# Patient Record
Sex: Female | Born: 1935 | Race: Black or African American | Hispanic: No
Health system: Southern US, Community
[De-identification: ages and names within clinical notes are randomized; demographics above are authoritative.]

---

## 2003-05-23 ENCOUNTER — Other Ambulatory Visit: Payer: Self-pay

## 2003-09-25 ENCOUNTER — Other Ambulatory Visit: Payer: Self-pay

## 2004-01-13 ENCOUNTER — Other Ambulatory Visit: Payer: Self-pay

## 2004-01-16 ENCOUNTER — Other Ambulatory Visit: Payer: Self-pay

## 2004-03-22 ENCOUNTER — Other Ambulatory Visit: Payer: Self-pay

## 2004-04-01 ENCOUNTER — Other Ambulatory Visit: Payer: Self-pay

## 2004-04-27 ENCOUNTER — Other Ambulatory Visit: Payer: Self-pay

## 2004-04-27 ENCOUNTER — Inpatient Hospital Stay: Payer: Self-pay | Admitting: Specialist

## 2004-06-03 ENCOUNTER — Ambulatory Visit: Payer: Self-pay | Admitting: Internal Medicine

## 2004-06-20 ENCOUNTER — Ambulatory Visit: Payer: Self-pay | Admitting: Internal Medicine

## 2004-07-21 ENCOUNTER — Ambulatory Visit: Payer: Self-pay | Admitting: Internal Medicine

## 2004-09-16 ENCOUNTER — Ambulatory Visit: Payer: Self-pay | Admitting: Internal Medicine

## 2004-10-12 ENCOUNTER — Emergency Department: Payer: Self-pay | Admitting: Emergency Medicine

## 2005-03-21 ENCOUNTER — Other Ambulatory Visit: Payer: Self-pay

## 2005-03-21 ENCOUNTER — Emergency Department: Payer: Self-pay | Admitting: Unknown Physician Specialty

## 2006-02-27 ENCOUNTER — Emergency Department: Payer: Self-pay | Admitting: Emergency Medicine

## 2006-02-27 ENCOUNTER — Other Ambulatory Visit: Payer: Self-pay

## 2006-08-05 ENCOUNTER — Emergency Department: Payer: Self-pay | Admitting: Emergency Medicine

## 2006-11-22 ENCOUNTER — Emergency Department: Payer: Self-pay | Admitting: Emergency Medicine

## 2006-12-01 ENCOUNTER — Inpatient Hospital Stay: Payer: Self-pay | Admitting: Specialist

## 2006-12-01 ENCOUNTER — Other Ambulatory Visit: Payer: Self-pay

## 2006-12-02 ENCOUNTER — Other Ambulatory Visit: Payer: Self-pay

## 2006-12-10 ENCOUNTER — Emergency Department: Payer: Self-pay | Admitting: Emergency Medicine

## 2006-12-10 ENCOUNTER — Other Ambulatory Visit: Payer: Self-pay

## 2006-12-16 ENCOUNTER — Other Ambulatory Visit: Payer: Self-pay

## 2006-12-16 ENCOUNTER — Emergency Department: Payer: Self-pay | Admitting: Emergency Medicine

## 2007-01-11 ENCOUNTER — Ambulatory Visit: Payer: Self-pay | Admitting: Specialist

## 2007-01-25 ENCOUNTER — Emergency Department: Payer: Self-pay | Admitting: Emergency Medicine

## 2008-02-05 ENCOUNTER — Emergency Department: Payer: Self-pay | Admitting: Emergency Medicine

## 2008-02-05 ENCOUNTER — Other Ambulatory Visit: Payer: Self-pay

## 2008-05-15 ENCOUNTER — Inpatient Hospital Stay: Payer: Self-pay | Admitting: Internal Medicine

## 2008-09-19 ENCOUNTER — Ambulatory Visit: Payer: Self-pay | Admitting: Specialist

## 2008-11-22 ENCOUNTER — Inpatient Hospital Stay: Payer: Self-pay | Admitting: Specialist

## 2009-01-26 ENCOUNTER — Emergency Department: Payer: Self-pay | Admitting: Emergency Medicine

## 2009-05-08 ENCOUNTER — Inpatient Hospital Stay: Payer: Self-pay | Admitting: Specialist

## 2009-10-05 ENCOUNTER — Inpatient Hospital Stay: Payer: Self-pay | Admitting: Specialist

## 2009-11-05 ENCOUNTER — Emergency Department: Payer: Self-pay

## 2010-04-18 ENCOUNTER — Emergency Department: Payer: Self-pay | Admitting: Emergency Medicine

## 2010-08-14 ENCOUNTER — Inpatient Hospital Stay: Payer: Self-pay | Admitting: Specialist

## 2010-08-21 ENCOUNTER — Emergency Department: Payer: Self-pay | Admitting: Emergency Medicine

## 2010-10-20 ENCOUNTER — Ambulatory Visit: Payer: Self-pay | Admitting: Internal Medicine

## 2010-11-17 ENCOUNTER — Inpatient Hospital Stay: Payer: Self-pay | Admitting: Specialist

## 2010-11-19 ENCOUNTER — Ambulatory Visit: Payer: Self-pay | Admitting: Internal Medicine

## 2010-12-20 ENCOUNTER — Ambulatory Visit: Payer: Self-pay | Admitting: Internal Medicine

## 2011-01-05 ENCOUNTER — Inpatient Hospital Stay: Payer: Self-pay | Admitting: Specialist

## 2011-04-19 ENCOUNTER — Inpatient Hospital Stay: Payer: Self-pay | Admitting: Internal Medicine

## 2011-11-19 ENCOUNTER — Ambulatory Visit: Payer: Self-pay | Admitting: Internal Medicine

## 2011-12-12 ENCOUNTER — Inpatient Hospital Stay: Payer: Self-pay | Admitting: Student

## 2011-12-12 LAB — URINALYSIS, COMPLETE
Bacteria: NONE SEEN
Bilirubin,UR: NEGATIVE
Glucose,UR: NEGATIVE mg/dL (ref 0–75)
Nitrite: NEGATIVE
Ph: 7 (ref 4.5–8.0)
Protein: 30
RBC,UR: 1 /HPF (ref 0–5)
Specific Gravity: 1.013 (ref 1.003–1.030)
WBC UR: 49 /HPF (ref 0–5)

## 2011-12-12 LAB — COMPREHENSIVE METABOLIC PANEL
BUN: 45 mg/dL — ABNORMAL HIGH (ref 7–18)
Co2: 22 mmol/L (ref 21–32)
Creatinine: 2 mg/dL — ABNORMAL HIGH (ref 0.60–1.30)
EGFR (African American): 25 — ABNORMAL LOW
EGFR (Non-African Amer.): 21 — ABNORMAL LOW
Osmolality: 309 (ref 275–301)
Potassium: 5.1 mmol/L (ref 3.5–5.1)
SGOT(AST): 48 U/L — ABNORMAL HIGH (ref 15–37)

## 2011-12-12 LAB — CK TOTAL AND CKMB (NOT AT ARMC)
CK, Total: 226 U/L — ABNORMAL HIGH (ref 21–215)
CK-MB: 1.1 ng/mL (ref 0.5–3.6)

## 2011-12-12 LAB — TROPONIN I: Troponin-I: <0.02 ng/mL

## 2011-12-12 LAB — CBC
HCT: 44.9 % (ref 35.0–47.0)
HGB: 13.7 g/dL (ref 12.0–16.0)
MCH: 25.1 pg — ABNORMAL LOW (ref 26.0–34.0)
MCHC: 30.5 g/dL — ABNORMAL LOW (ref 32.0–36.0)
Platelet: 202 10*3/uL (ref 150–440)
RDW: 15 % — ABNORMAL HIGH (ref 11.5–14.5)
WBC: 2.3 10*3/uL — ABNORMAL LOW (ref 3.6–11.0)

## 2011-12-12 LAB — SODIUM: Sodium: 150 mmol/L — ABNORMAL HIGH (ref 136–145)

## 2011-12-12 LAB — CLOSTRIDIUM DIFFICILE BY PCR

## 2011-12-12 LAB — URIC ACID: Uric Acid: 6.6 mg/dL — ABNORMAL HIGH (ref 2.6–6.0)

## 2011-12-12 LAB — OCCULT BLOOD X 1 CARD TO LAB, STOOL: Occult Blood, Feces: NEGATIVE

## 2011-12-13 LAB — CBC WITH DIFFERENTIAL/PLATELET
Basophil #: 0 10*3/uL (ref 0.0–0.1)
Basophil %: 0.2 %
Eosinophil #: 0 10*3/uL (ref 0.0–0.7)
Eosinophil %: 0.1 %
HCT: 36.2 % (ref 35.0–47.0)
Lymphocyte %: 6.9 %
Monocyte #: 0.5 x10 3/mm (ref 0.2–0.9)
RDW: 15 % — ABNORMAL HIGH (ref 11.5–14.5)
WBC: 9.1 10*3/uL (ref 3.6–11.0)

## 2011-12-13 LAB — URINE CULTURE

## 2011-12-13 LAB — BASIC METABOLIC PANEL
BUN: 50 mg/dL — ABNORMAL HIGH (ref 7–18)
Chloride: 113 mmol/L — ABNORMAL HIGH (ref 98–107)
Creatinine: 2.06 mg/dL — ABNORMAL HIGH (ref 0.60–1.30)
EGFR (African American): 24 — ABNORMAL LOW
EGFR (Non-African Amer.): 20 — ABNORMAL LOW
Glucose: 157 mg/dL — ABNORMAL HIGH (ref 65–99)
Osmolality: 309 (ref 275–301)
Sodium: 147 mmol/L — ABNORMAL HIGH (ref 136–145)

## 2011-12-13 LAB — TROPONIN I: Troponin-I: <0.02 ng/mL

## 2011-12-14 LAB — BASIC METABOLIC PANEL
Anion Gap: 11 (ref 7–16)
BUN: 40 mg/dL — ABNORMAL HIGH (ref 7–18)
Calcium, Total: 7.3 mg/dL — ABNORMAL LOW (ref 8.5–10.1)
Chloride: 113 mmol/L — ABNORMAL HIGH (ref 98–107)
Co2: 17 mmol/L — ABNORMAL LOW (ref 21–32)
Creatinine: 1.62 mg/dL — ABNORMAL HIGH (ref 0.60–1.30)
EGFR (African American): 32 — ABNORMAL LOW
EGFR (Non-African Amer.): 27 — ABNORMAL LOW
Glucose: 92 mg/dL (ref 65–99)
Osmolality: 291 (ref 275–301)
Sodium: 141 mmol/L (ref 136–145)

## 2011-12-14 LAB — LIPID PANEL
Cholesterol: 126 mg/dL (ref 0–200)
Triglycerides: 65 mg/dL (ref 0–200)
VLDL Cholesterol, Calc: 13 mg/dL (ref 5–40)

## 2011-12-15 LAB — BASIC METABOLIC PANEL
Anion Gap: 10 (ref 7–16)
BUN: 35 mg/dL — ABNORMAL HIGH (ref 7–18)
Calcium, Total: 8.1 mg/dL — ABNORMAL LOW (ref 8.5–10.1)
Chloride: 104 mmol/L (ref 98–107)
Co2: 22 mmol/L (ref 21–32)
Creatinine: 1.61 mg/dL — ABNORMAL HIGH (ref 0.60–1.30)
Osmolality: 283 (ref 275–301)
Potassium: 3.6 mmol/L (ref 3.5–5.1)

## 2011-12-16 LAB — CBC WITH DIFFERENTIAL/PLATELET
Basophil #: 0.1 10*3/uL (ref 0.0–0.1)
Basophil %: 0.6 %
Eosinophil #: 0 10*3/uL (ref 0.0–0.7)
Eosinophil %: 0.1 %
HCT: 41.5 % (ref 35.0–47.0)
Lymphocyte %: 7.9 %
MCH: 25.8 pg — ABNORMAL LOW (ref 26.0–34.0)
MCV: 79 fL — ABNORMAL LOW (ref 80–100)
Neutrophil #: 8.1 10*3/uL — ABNORMAL HIGH (ref 1.4–6.5)
Neutrophil %: 89.6 %
RDW: 14.9 % — ABNORMAL HIGH (ref 11.5–14.5)

## 2011-12-16 LAB — BASIC METABOLIC PANEL
Anion Gap: 10 (ref 7–16)
BUN: 31 mg/dL — ABNORMAL HIGH (ref 7–18)
Calcium, Total: 8.6 mg/dL (ref 8.5–10.1)
Co2: 23 mmol/L (ref 21–32)
Creatinine: 1.45 mg/dL — ABNORMAL HIGH (ref 0.60–1.30)
EGFR (African American): 36 — ABNORMAL LOW
Glucose: 85 mg/dL (ref 65–99)
Osmolality: 281 (ref 275–301)
Potassium: 3.6 mmol/L (ref 3.5–5.1)
Sodium: 138 mmol/L (ref 136–145)

## 2011-12-20 ENCOUNTER — Ambulatory Visit: Payer: Self-pay | Admitting: Internal Medicine

## 2012-01-19 DEATH — deceased

## 2014-03-27 IMAGING — US US CAROTID DUPLEX BILAT
1 series · 14 of 24 positions shown · non-contrast
Comparison: none

REASON FOR EXAM: cva. eval for stenosis
COMMENTS:

[Series 1: us carotid duplex bilat · 14 of 66 slices shown]
[im 1/66]
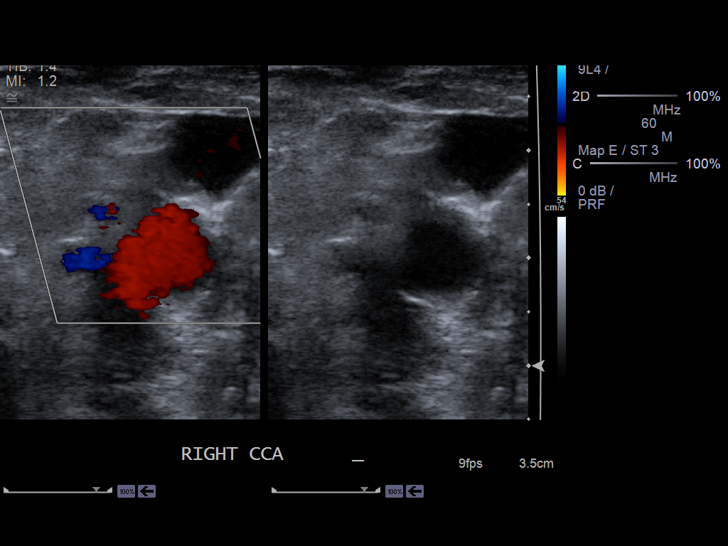
[im 6/66]
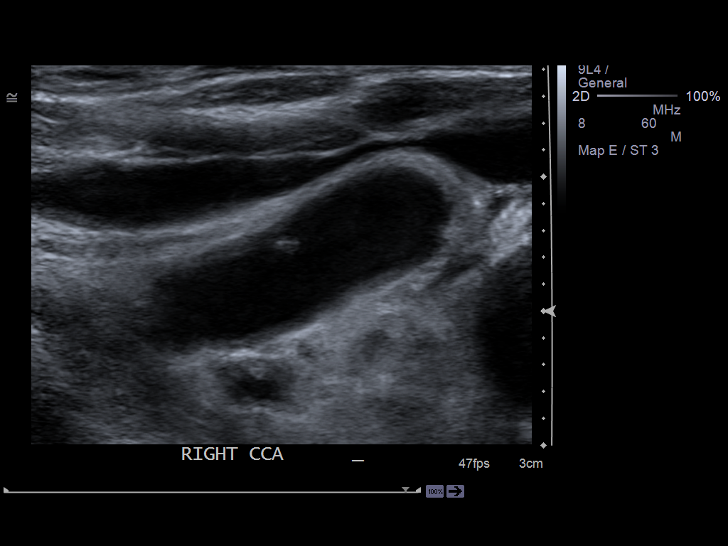
[im 12/66]
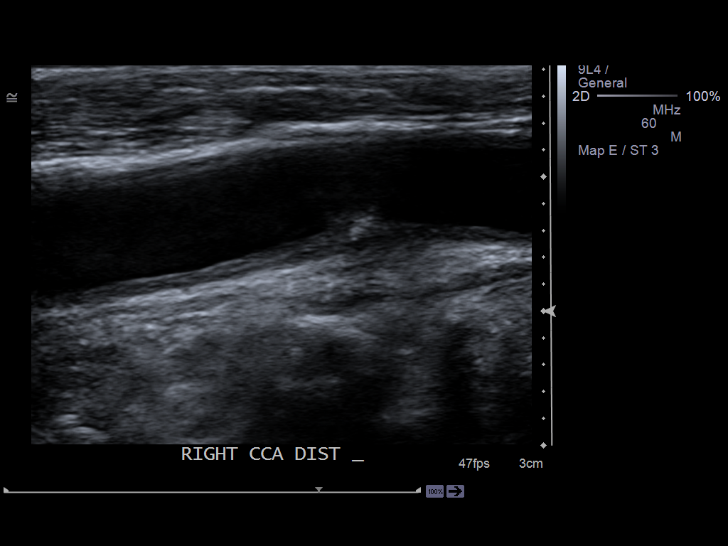
[im 17/66]
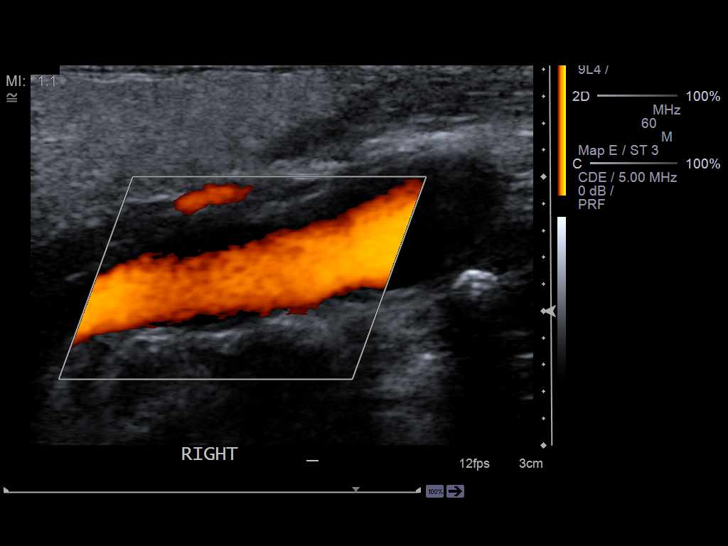
[im 20/66]
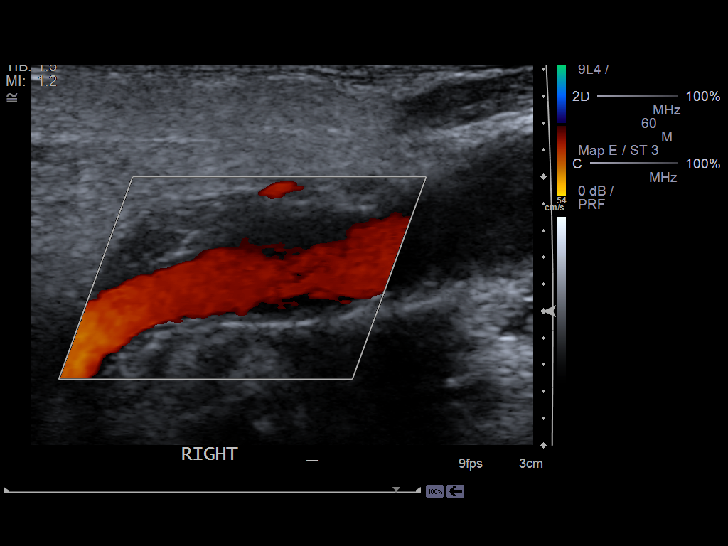
[im 26/66]
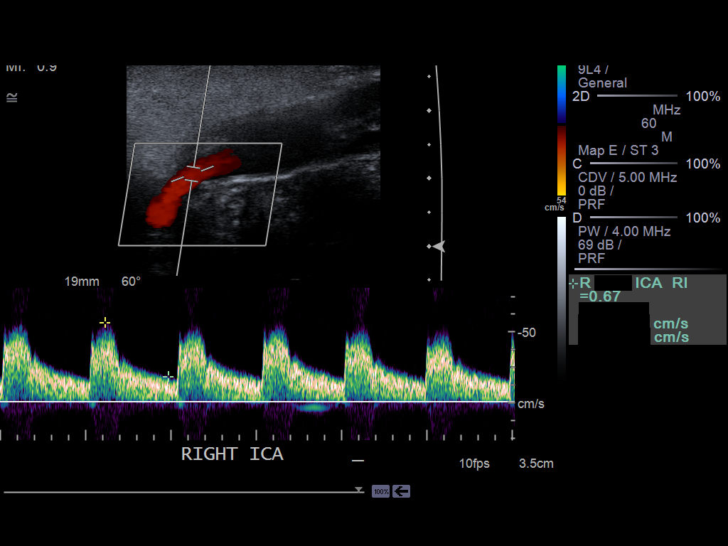
[im 32/66]
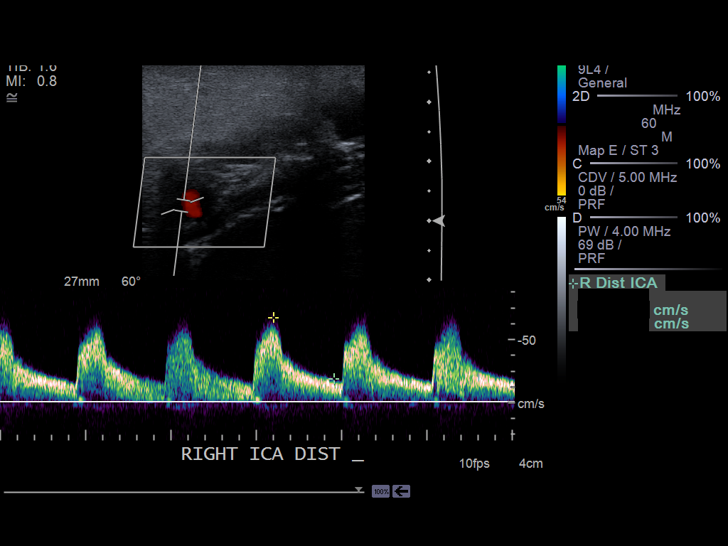
[im 34/66]
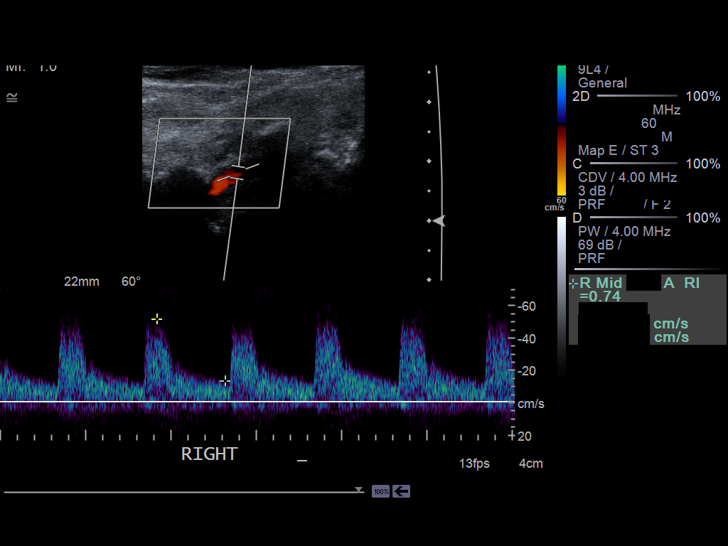
[im 40/66]
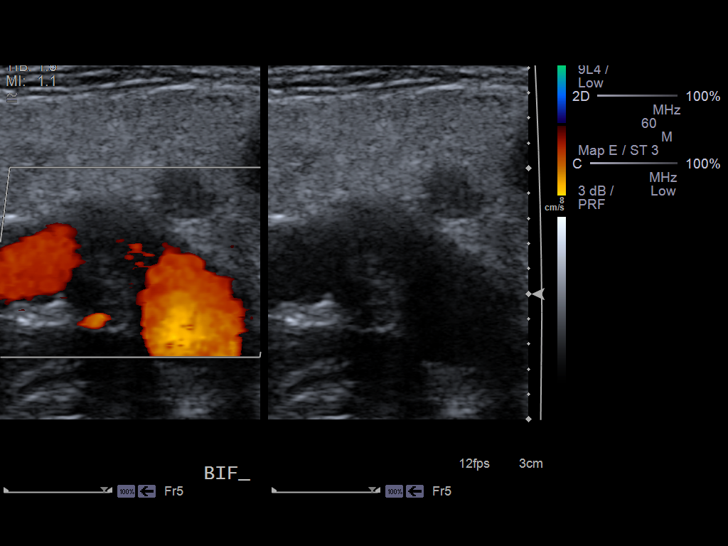
[im 46/66]
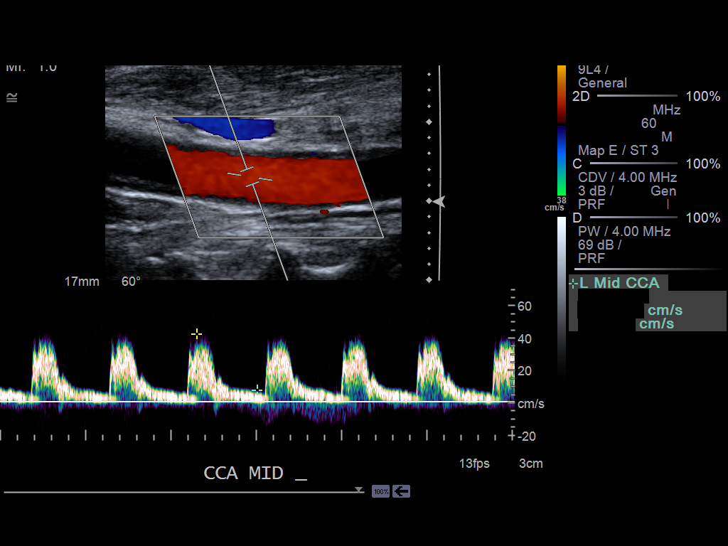
[im 51/66]
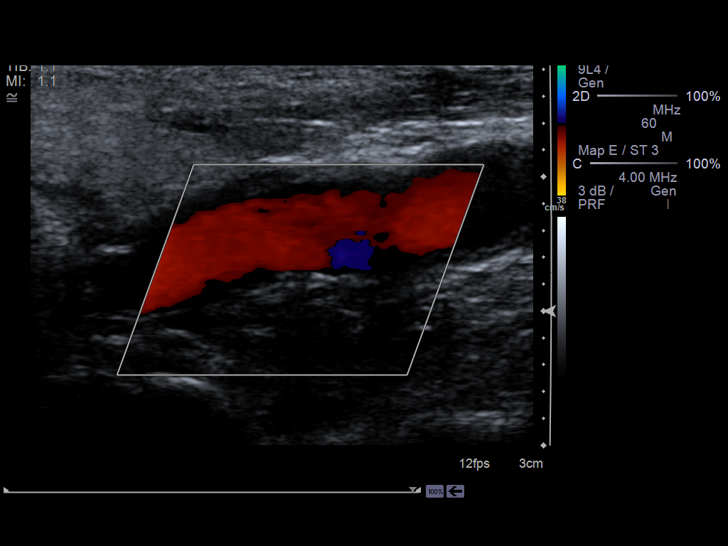
[im 54/66]
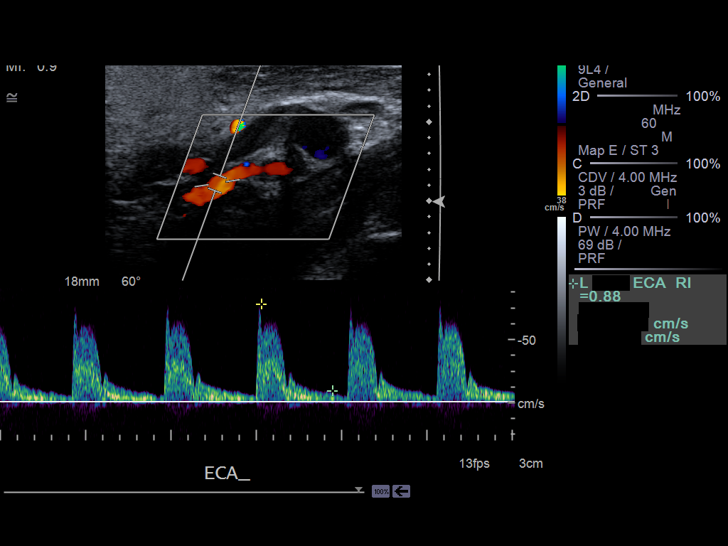
[im 60/66]
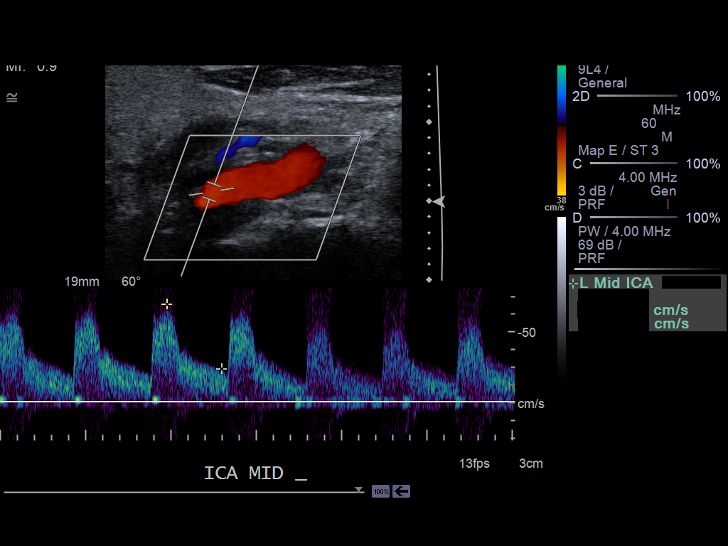
[im 66/66]
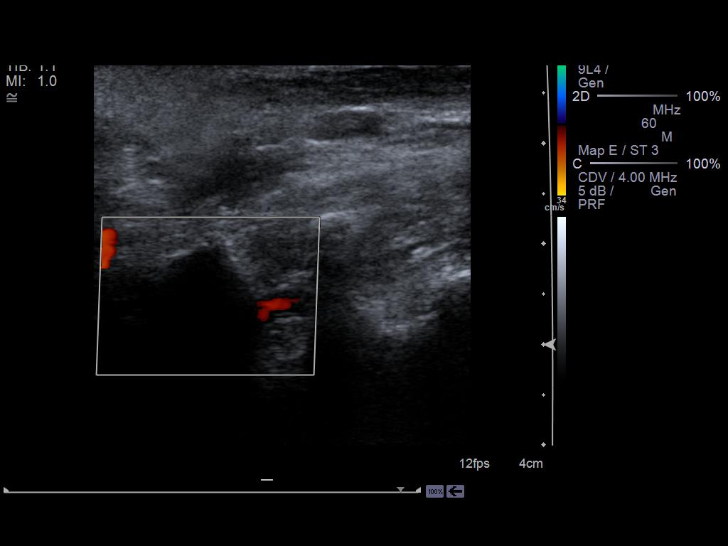

[14 of 24 positions shown; findings below may reference images not displayed]

PROCEDURE:     US  - US CAROTID DOPPLER BILATERAL  - December 14, 2011 [DATE]

RESULT:     Bilateral carotid color flow Duplex Doppler reveals bilateral
calcified carotid plaques. No flow limiting stenosis. Visually, however,
peak systolic flow velocity ratio on the right is 2.3, on the left is
and a significant stenosis cannot be excluded. The vertebrals are patent.
Peak systolic flow velocity ratios have increased from prior exam of
11/17/2010.
IMPRESSION: 1. Bilateral carotid stenosis. There is increase in peak systolic flow
velocity ratios from prior exam. Although no flow limiting stenosis is noted
visually, a significant stenosis cannot be excluded. MRA or CTA should be
considered for further evaluation.

[REDACTED]

## 2014-11-12 NOTE — H&P (Signed)
PATIENT NAME:  Sandra Collier, Sandra Collier MR#:  [id number] DATE OF BIRTH:  [birth year]  DATE OF ADMISSION:  12/12/2011  PRIMARY CARE PHYSICIAN: Orson AloeMichael Blocker, MD  CHIEF COMPLAINT: Abdominal pain.   HISTORY OF PRESENT ILLNESS: This is a 81 year old female who lives with her family, has a history of dementia and is not the best historian. As per family, they got her up this morning, she had a shaking episode but was coherent at that time, they sat her up and then she passed out. They laid her back. In one minute she came to and started to vomit. They said she has been weak and not walking around for the past couple of days, probably with a gout attack on the right ankle. Then the family moved her out of the urine that she was in and she passed out again for 30 seconds. Then they were trying to comb her hair and she went out again for about one per minute, no seizure-like activity noted. She was brought to the emergency room. She was found to be hypernatremic with a sodium of 150, acute on chronic renal failure with a creatinine up at 2, and GFR of 25, a positive urinalysis, and hospitalist services were contacted for further evaluation. The patient does have an underlying dementia sacroiliac is not that helpful in history, but the family at bedside is very helpful with history. The patient has had numerous admissions in the past for syncope with negative work-up to date.   PAST MEDICAL HISTORY:  1. Coronary artery disease. 2. Dementia. 3. Hypothyroidism. 4. Hypertension. 5. Chronic kidney disease. 6. Gout. 7. Several hospitalizations for syncope.   PAST SURGICAL HISTORY: None.   ALLERGIES: No known drug allergies.     MEDICATIONS:  1. Xanax 0.25 mg at bedtime.  2. Aspirin 81 mg daily.  3. Benazepril 10 mg at bedtime.  4. Imdur 30 mg daily.  5. Levothyroxine 88 mcg daily.  6. Megace 20 mg daily.  7. Sanctura 60 mg daily.  8. Sodium bicarbonate 650 mg two tablets twice a day. 9. Tylenol Arthritis  p.r.n.   SOCIAL HISTORY: She lives with family, used to be a custodian. No alcohol. No smoking. No drug use.   FAMILY HISTORY: From the old chart, her parents died of natural causes.   REVIEW OF SYSTEMS: CONSTITUTIONAL: Difficult to obtain secondary to dementia. No fever, chills, or sweats, but is cold all the time. Positive for weight loss, 5 pounds over the past four months. Positive for weakness. EYES: She does wear glasses. EARS, NOSE, MOUTH, AND THROAT: Decreased hearing. Positive for sore throat. No difficulty swallowing. CARDIOVASCULAR: No chest pain. No palpitations. RESPIRATORY: Positive for cough and yellow sputum. No shortness of breath. GASTROINTESTINAL: Positive for nausea. Positive FOR vomiting. No hematemesis. Positive for abdominal pain. No diarrhea except for today in the emergency room. No bright red blood per rectum. No melena. MUSCULOSKELETAL: Positive for right ankle pain and arthritis pain in the hands. NEUROLOGIC: Positive for fainting, three episodes today. PSYCHIATRIC: On medication for anxiety and dementia. ENDOCRINE: Positive for hypothyroidism. HEMATOLOGIC/LYMPHATIC: No anemia.   PHYSICAL EXAMINATION:   VITAL SIGNS: Temperature 97.4, pulse 70, respirations 18, blood pressure 108/63, and pulse oximetry 98%.   GENERAL: No respiratory distress. The patient is sitting in a gown of diarrhea.   EYES: Conjunctivae and lids normal. Pupils equal, round, and reactive to light.   EARS, NOSE, MOUTH, AND THROAT: Tympanic membranes obscured by wax. Nasal mucosa no erythema. Throat no erythema. No exudate  seen. Lips and gums no lesions.   NECK: No JVD. No bruits. No lymphadenopathy. No thyromegaly. No thyroid nodules palpated.   RESPIRATORY: Lungs are clear to auscultation. No use of accessory muscles to breathe. No rhonchi, rales, or wheeze heard.   HEART: S1 and S2 normal. No gallops, rubs, or murmurs heard. Carotid upstroke 2+ bilaterally. No bruits.   EXTREMITIES: Dorsalis  pedis pulses 2+ bilaterally. Swelling of the right ankle.   ABDOMEN: Soft, slight tenderness in the lower abdomen over the bladder. No organomegaly/splenomegaly. Normoactive bowel sounds. Diarrhea seen leaking through the gown.   LYMPHATIC: No lymph nodes in the neck.   MUSCULOSKELETAL: Right ankle swelling, pain on limited movement there.   SKIN: No ulcers or lesions.   NEUROLOGIC: Cranial nerves difficult to test secondary to dementia. Deep tendon reflexes 1+ bilateral lower extremities.   PSYCHIATRIC: The patient is oriented to person and place, answers simple yes or no questions, unable to elaborate very much.  RESULTS: EKG ordered by me.   ASSESSMENT AND PLAN:  1. Acute renal failure, on chronic kidney disease, with hypernatremia and dehydration. We will give IV fluid hydration, already received 1 liter of normal saline in the emergency room. We will switch over to D5W at 75 mL/hour. Serial sodium levels. The patient's GFR is 25 currently. Last blood test in the hospital record is back in October 2012 with a creatinine at that time of 1.31, sodium at that time 145, and a GFR of 49. We will continue hydration, check creatinine every day, and continue to monitor closely.  2. Acute gout attack, unable to walk. We will give prednisone stat and prednisone daily. We will give one dose of colchicine. Careful with this medication with the acute renal failure. We will check a uric acid level and obtain physical therapy evaluation.  3. Syncope with multiple episodes of this in the past. I will not work this up further except put on telemetry monitoring. Had echo and carotid ultrasound last year. Most likely this is vasovagal with dehydration and with the nausea and vomiting.  4. Possibility of urinary tract infection, positive urinalysis. We will give Rocephin and send off a urine culture.  5. Diarrhea in the emergency room. We send off stool studies. 6. Hypothyroidism. Continue levothyroxine.   7. Dementia. Continue Aricept.  8. Failure to thrive. On Megace.  9. Wax in the ears. We will put on Debrox otic solution.   CODE STATUS: FULL CODE.  TIME SPENT ON ADMISSION: 55 minutes.  ____________________________ Herschell Dimes. Renae Gloss, MD rjw:slb D: 12/12/2011 13:48:32 ET T: 12/12/2011 14:06:23 ET JOB#: [id number]  cc: Herschell Dimes. Renae Gloss, MD, <Dictator> Rosalyn Gess. Blocker, MD Salley Scarlet MD ELECTRONICALLY SIGNED 12/21/2011 14:04

## 2014-11-12 NOTE — Discharge Summary (Signed)
PATIENT NAMModena Collier:  Tesar, Clea L MR#:  [id number] DATE OF BIRTH:  01/01/1917  DATE OF ADMISSION:  12/12/2011 DATE OF DISCHARGE:  12/16/2011  CHIEF COMPLAINT: Abdominal pain.   DISCHARGE DIAGNOSES:  1. Acute cerebrovascular accident. 2. Acute on chronic renal failure secondary to dehydration. 3. Hypernatremia.  4. Acute gout attack. 5. Syncope. 6. Diarrhea, resolved.  7. Hypothyroidism.  8. Advanced dementia.   DISPOSITION: The patient will be transferred to a Hospice Home with the following medications.   DISCHARGE MEDICATIONS:  1. Roxanol 20 mg/mL, 0.25 to 0.5 mL p.o./sublingual every 1 to 2 hours p.r.n. for pain and dyspnea.  2. Lorazepam 0.5 to 1 mg p.o./sublingual every 2 to 4 hours p.r.n. for agitation/anxiety.  3. Ranitidine 150 mg p.o. b.i.d.  4. ABHR suppository, 1 per rectum every 4 to 6 hours p.r.n. for nonobstructed refractory nausea and vomiting.  5. Levothyroxine 88 mcg daily.  6. Alprazolam 0.25 mg once a day at bedtime.   NOTE: Hold p.o. medications as unable to swallow.   CONSULTANTS:  1. Physical Therapy. 2. Speech Therapy. 3. Palliative Care with Dr. Harvie JuniorPhifer.   DISPOSITION: Transfer to the Hospice Home .   ACTIVITY: As tolerated.  DIET: As tolerated.   HISTORY AND PHYSICAL For full details of the History and Physical, please see the dictation done on 12/12/2011 by Dr. Renae GlossWieting; but briefly, the patient is a 81 year old African American female with history of significant dementia, coronary artery disease, hypothyroidism, who had presented for syncope. She was found to be hypernatremic with sodium of 150, with renal failure with creatinine of 2 with a GFR of 25, and admitted to the Hospitalist Service for further evaluation and management. The patient has had multiple syncopal episodes in the past, and on arrival CT of the head was not done.   SIGNIFICANT LABORATORY, DIAGNOSTIC AND RADIOLOGICAL DATA:  Initial creatinine was 2, sodium 150, BUN 45, chloride 118.   Uric acid level on arrival 6.6. Liver function tests: AST was 48, ALT 16, albumin 2.6 on arrival.  Troponins were negative x3.  Initial WBC 2.3, hemoglobin 13.7, platelets 202.  Urine cultures grew mixed flora.  Clostridium difficile was negative.  Urinalysis: 3+ leukocyte esterase, no nitrites, 49 WBC. Occult blood was negative. Echocardiogram showed ejection fraction of 35 to 45%. No thrombus. Moderate global hypokinesis of the left ventricle. Moderate mitral regurgitation, moderate-to-severe tricuspid regurgitation, mild-to-moderate aortic regurgitation.  BUN by discharge was 31, creatinine was 1.45, chloride of 105.  WBC was 9, hemoglobin was 13.6. MRI of the brain on the 25th of May did show findings consistent with regions of MCA distribution acute/subacute infarction within the right temporal lobe as well as temporoparietal region, moderate-to-severe involutional changes. Study is degraded by motion artifact .  CT of the head done on 26th of May without contrast showed right frontoparietal frontal temporal edema suggesting stroke. No evidence of acute hemorrhage.  Ultrasound of the carotids bilaterally did show bilateral carotid stenosis, increase in peak systolic flow velocity ratio from prior exam. Although no flow-limiting stenosis is noted visually, significant stenosis cannot be excluded. MRA or CT angio was recommended.     HOSPITAL COURSE:  Syncope: The patient has significant history of multiple syncopal episodes and negative work-up in the past. Initially, no work-up was recommended. The patient was noted to have decreased mentation and some slurred speech and facial asymmetry by her son, and therefore an MRI of the brain was obtained which did show acute stroke as described above. The patient also  became more aphasic and less responsive. Further work-up for the stroke was done, including ultrasound of the carotids as well as an echocardiogram, the results of which are above. The  patient did have significant stenosis per the ultrasound; however, given the multiple comorbidities the family did not fever any aggressive measures. Echocardiogram was obtained which showed no source for the cerebrovascular accident and ejection fraction of 35 to 45%.   The patient was seen by Speech Evaluation and made n.p.o. except medications as she had significant dysphagia, and was re-examined again and again failed a swallow evaluation. She was started on IV fluids and medications for blood pressure were held, and she was started on Aggrenox and statin; however, the patient did not have any sustained mode of p.o.   In regards to the acute gouty cavity attack, she was given a dose of colchicine and received several days of prednisone with resolution of the right ankle tenderness, redness, and edema.   In regards to the acute renal failure, this was likely in the setting of dehydration and poor p.o. intake. The patient overall does not have good eating habits and, therefore, was started on IV fluids and her ACE inhibitor was held. The patient also was hypernatremic, likely in the setting of dehydration and poor p.o. intake. She was started on IV fluids, and her hypernatremia and kidney function improved dramatically.   In regards to a possible urinary tract infection, urine cultures were sent, however, did grow mixed flora; and her ceftriaxone which she was started initially was discontinued.   Overall, the patient did poorly in the hospital, becoming more lethargic, less responsive, aphasic; and Palliative Care consult was obtained. As the family did not have any plan for aggressive measures given the overall poor prognosis, and no means for sustained p.o. intake, the patient was made DO NOT RESUSCITATE, and the family agreed to transfer the patient to Hospice for palliative care and comfort care measures.   CODE STATUS: The patient is DO NOT RESUSCITATE.      Total Time Spent: 35 minutes   ____________________________ Krystal Eaton, MD sa:cbb D: 12/16/2011 16:06:12 ET T: 12/17/2011 11:58:02 ET JOB#: [id number]  cc: Krystal Eaton, MD, <Dictator> Krystal Eaton MD ELECTRONICALLY SIGNED 01-17-12 12:37
